# Patient Record
Sex: Male | Born: 2000 | Race: Black or African American | Hispanic: No | Marital: Single | Smoking: Never smoker
Health system: Southern US, Community
[De-identification: ages and names within clinical notes are randomized; demographics above are authoritative.]

## PROBLEM LIST (undated history)

## (undated) ENCOUNTER — Ambulatory Visit (HOSPITAL_COMMUNITY): Payer: MEDICAID | Source: Home / Self Care

## (undated) ENCOUNTER — Emergency Department (HOSPITAL_COMMUNITY): Admission: EM | Payer: Medicaid Other | Source: Home / Self Care

## (undated) DIAGNOSIS — W3400XA Accidental discharge from unspecified firearms or gun, initial encounter: Secondary | ICD-10-CM

## (undated) DIAGNOSIS — F988 Other specified behavioral and emotional disorders with onset usually occurring in childhood and adolescence: Secondary | ICD-10-CM

## (undated) HISTORY — PX: OTHER SURGICAL HISTORY: SHX169

---
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
## 2022-04-30 ENCOUNTER — Other Ambulatory Visit: Payer: Self-pay

## 2022-04-30 ENCOUNTER — Encounter (HOSPITAL_COMMUNITY): Payer: Self-pay

## 2022-04-30 ENCOUNTER — Emergency Department (HOSPITAL_COMMUNITY): Payer: Medicaid Other

## 2022-04-30 ENCOUNTER — Emergency Department (HOSPITAL_COMMUNITY)
Admission: EM | Admit: 2022-04-30 | Discharge: 2022-04-30 | Disposition: A | Discharge status: Home / Self Care | Payer: Medicaid Other | Attending: Emergency Medicine | Admitting: Emergency Medicine

## 2022-04-30 DIAGNOSIS — K5900 Constipation, unspecified: Secondary | ICD-10-CM

## 2022-04-30 DIAGNOSIS — R1030 Lower abdominal pain, unspecified: Secondary | ICD-10-CM | POA: Diagnosis present

## 2022-04-30 DIAGNOSIS — K5641 Fecal impaction: Secondary | ICD-10-CM | POA: Diagnosis not present

## 2022-04-30 DIAGNOSIS — Z1152 Encounter for screening for COVID-19: Secondary | ICD-10-CM | POA: Diagnosis not present

## 2022-04-30 LAB — CBC WITH DIFFERENTIAL/PLATELET
Abs Immature Granulocytes: 0.02 10*3/uL (ref 0.00–0.07)
Basophils Absolute: 0 10*3/uL (ref 0.0–0.1)
Basophils Relative: 1 %
Eosinophils Absolute: 0.1 10*3/uL (ref 0.0–0.5)
Eosinophils Relative: 4 %
HCT: 41.1 % (ref 39.0–52.0)
Hemoglobin: 14.4 g/dL (ref 13.0–17.0)
Immature Granulocytes: 1 %
Lymphocytes Relative: 35 %
Lymphs Abs: 1.2 10*3/uL (ref 0.7–4.0)
MCH: 31.9 pg (ref 26.0–34.0)
MCHC: 35 g/dL (ref 30.0–36.0)
MCV: 90.9 fL (ref 80.0–100.0)
Monocytes Absolute: 0.3 10*3/uL (ref 0.1–1.0)
Monocytes Relative: 10 %
Neutro Abs: 1.7 10*3/uL (ref 1.7–7.7)
Neutrophils Relative %: 49 %
Platelets: 238 10*3/uL (ref 150–400)
RBC: 4.52 MIL/uL (ref 4.22–5.81)
RDW: 11.5 % (ref 11.5–15.5)
WBC: 3.4 10*3/uL — ABNORMAL LOW (ref 4.0–10.5)
nRBC: 0 % (ref 0.0–0.2)

## 2022-04-30 LAB — COMPREHENSIVE METABOLIC PANEL
ALT: 13 U/L (ref 0–44)
AST: 24 U/L (ref 15–41)
Albumin: 3.9 g/dL (ref 3.5–5.0)
Alkaline Phosphatase: 78 U/L (ref 38–126)
Anion gap: 7 (ref 5–15)
BUN: 11 mg/dL (ref 6–20)
CO2: 28 mmol/L (ref 22–32)
Calcium: 9.4 mg/dL (ref 8.9–10.3)
Chloride: 103 mmol/L (ref 98–111)
Creatinine, Ser: 0.9 mg/dL (ref 0.61–1.24)
GFR, Estimated: >60 mL/min (ref >60)
Glucose, Bld: 99 mg/dL (ref 70–99)
Potassium: 3.9 mmol/L (ref 3.5–5.1)
Sodium: 138 mmol/L (ref 135–145)
Total Bilirubin: 0.6 mg/dL (ref 0.3–1.2)
Total Protein: 7 g/dL (ref 6.5–8.1)

## 2022-04-30 LAB — URINALYSIS, ROUTINE W REFLEX MICROSCOPIC
Bilirubin Urine: NEGATIVE
Glucose, UA: NEGATIVE mg/dL
Hgb urine dipstick: NEGATIVE
Ketones, ur: NEGATIVE mg/dL
Leukocytes,Ua: NEGATIVE
Nitrite: NEGATIVE
Protein, ur: NEGATIVE mg/dL
Specific Gravity, Urine: 1.019 (ref 1.005–1.030)
pH: 6 (ref 5.0–8.0)

## 2022-04-30 LAB — RESP PANEL BY RT-PCR (RSV, FLU A&B, COVID)  RVPGX2
Influenza A by PCR: NEGATIVE
Influenza B by PCR: NEGATIVE
Resp Syncytial Virus by PCR: NEGATIVE
SARS Coronavirus 2 by RT PCR: NEGATIVE

## 2022-04-30 MED ORDER — MAGNESIUM CITRATE PO SOLN
1.0000 | Freq: Once | ORAL | Status: AC
  Administered 2022-04-30: 1 via ORAL
  Filled 2022-04-30: qty 296

## 2022-04-30 MED ORDER — IOHEXOL 350 MG/ML SOLN
75.0000 mL | Freq: Once | INTRAVENOUS | Status: AC | PRN
  Administered 2022-04-30: 75 mL via INTRAVENOUS

## 2022-04-30 NOTE — Discharge Instructions (Addendum)
Get an enema from the drug store.  Ask the pharmacist if you can't find it.  Also drink the whole bottle of stuff you got here to help you poop.  All the test were normal except for the stool in your rectum.

## 2022-04-30 NOTE — ED Provider Triage Note (Signed)
Emergency Medicine Provider Triage Evaluation Note  Carlos Ramirez , a 22 y.o. male  was evaluated in triage.  Pt complains of who presents for abdominal pain and constipation.  Patient states he has been unable to make a bowel movement for the past 5 days.  He states he is never had anything like this before.  He denies opiate or drug abuse.  He does feel like he has hard stool in his rectum that is difficult to move and painful.  He did not take anything prior to arrival.  Review of Systems  Positive: Abdominal pain Negative: Vomiting  Physical Exam  BP 108/87 (BP Location: Right Arm)   Pulse 98   Temp 98.9 F (37.2 C) (Oral)   Resp 18   Ht 5' 6"$  (1.676 m)   Wt 54.4 kg   SpO2 97%   BMI 19.37 kg/m  Gen:   Awake, no distress   Resp:  Normal effort  MSK:   Moves extremities without difficulty  Other:  Feels hot to the touch.  He has involuntary guarding and tenderness generally over the abdomen.  Medical Decision Making  Medically screening exam initiated at 1:49 PM.  Appropriate orders placed.  Jamal C Bigford was informed that the remainder of the evaluation will be completed by another provider, this initial triage assessment does not replace that evaluation, and the importance of remaining in the ED until their evaluation is complete.     Margarita Mail, PA-C 04/30/22 1351

## 2022-04-30 NOTE — ED Triage Notes (Signed)
Pt arrived POV from home c/o being constipated and not being able to have a bowel movement x5 days. Pt states his stomach is starting to hurt because of it.

## 2022-04-30 NOTE — ED Provider Notes (Signed)
[facility] Provider Note   CSN: [id number] Arrival date & time: 04/30/22  1312     History  Chief Complaint  Patient presents with   Constipation    Carlos Ramirez is a 22 y.o. male.  Patient is a 22 year old male with a history of ADD presenting today with complaints of constipation.  He reports that he has been unable to have a bowel movement for the last 5 days.  His lower abdomen is starting to hurt.  He has not had any nausea or vomiting.  He does report he still eating.  He reports there is a lot of pain and pressure in his rectum.  Denies any urinary problems.  No fever.  He has not tried taking anything for the constipation.  He denies any new medications or dietary changes.  Normally he has bowel movements every 1 to 3 days.  The history is provided by the patient.  Constipation      Home Medications Prior to Admission medications   Medication Sig Start Date End Date Taking? Authorizing Provider  SUMAtriptan (IMITREX) 100 MG tablet Take 1 tablet (100 mg total) by mouth every 2 (two) hours as needed for migraine. May repeat in 2 hours if headache persists or recurs. 12/13/21   Barrett Henle, MD      Allergies    Patient has no known allergies.    Review of Systems   Review of Systems  Gastrointestinal:  Positive for constipation.    Physical Exam Updated Vital Signs BP 119/72   Pulse 89   Temp 98.9 F (37.2 C) (Oral)   Resp 18   Ht 5' 6"$  (1.676 m)   Wt 54.4 kg   SpO2 98%   BMI 19.37 kg/m  Physical Exam Vitals and nursing note reviewed.  Constitutional:      General: He is not in acute distress.    Appearance: He is well-developed.  HENT:     Head: Normocephalic and atraumatic.  Eyes:     Conjunctiva/sclera: Conjunctivae normal.     Pupils: Pupils are equal, round, and reactive to light.  Cardiovascular:     Rate and Rhythm: Normal rate and regular rhythm.     Heart sounds: No murmur  heard. Pulmonary:     Effort: Pulmonary effort is normal. No respiratory distress.     Breath sounds: Normal breath sounds. No wheezing or rales.  Abdominal:     General: There is no distension.     Palpations: Abdomen is soft.     Tenderness: There is abdominal tenderness in the suprapubic area and left lower quadrant. There is no guarding or rebound.  Genitourinary:    Rectum: Normal.     Comments: No fecal impaction Musculoskeletal:        General: No tenderness. Normal range of motion.     Cervical back: Normal range of motion and neck supple.  Skin:    General: Skin is warm and dry.     Findings: No erythema or rash.  Neurological:     Mental Status: He is alert and oriented to person, place, and time. Mental status is at baseline.  Psychiatric:        Mood and Affect: Mood normal.        Behavior: Behavior normal.     ED Results / Procedures / Treatments   Labs (all labs ordered are listed, but only abnormal results are displayed) Labs Reviewed  CBC WITH DIFFERENTIAL/PLATELET -  Abnormal; Notable for the following components:      Result Value   WBC 3.4 (*)    All other components within normal limits  RESP PANEL BY RT-PCR (RSV, FLU A&B, COVID)  [id number]  COMPREHENSIVE METABOLIC PANEL  URINALYSIS, ROUTINE W REFLEX MICROSCOPIC    EKG None  Radiology CT ABDOMEN PELVIS W CONTRAST  Result Date: 04/30/2022 CLINICAL DATA:  Abdominal pain, acute, nonlocalized.  Constipation. EXAM: CT ABDOMEN AND PELVIS WITH CONTRAST TECHNIQUE: Multidetector CT imaging of the abdomen and pelvis was performed using the standard protocol following bolus administration of intravenous contrast. RADIATION DOSE REDUCTION: This exam was performed according to the departmental dose-optimization program which includes automated exposure control, adjustment of the mA and/or kV according to patient size and/or use of iterative reconstruction technique. CONTRAST:  75m OMNIPAQUE IOHEXOL 350 MG/ML SOLN  COMPARISON:  05/15/2018 FINDINGS: Lower chest: Normal Hepatobiliary: Liver parenchyma is normal.  No calcified gallstones. Pancreas: Normal Spleen: Normal Adrenals/Urinary Tract: Adrenal glands are normal. Kidneys are normal. Bladder is normal. Stomach/Bowel: Stomach is full of ingested material. Small bowel pattern is normal. Appendix is normal. There is a grossly normal amount of fecal matter within the colon in general. However, the patient does appear to have rectal fecal impaction. Vascular/Lymphatic: No vascular or nodal abnormality. Reproductive: Normal Other: No free fluid or air. Musculoskeletal: Scoliotic curvature convex to the left which could simply be positional. IMPRESSION: 1. The stomach is full of ingested material. Small bowel pattern is normal. There is a grossly normal amount of fecal matter within the colon in general. However, the patient does appear to have rectal fecal impaction. 2. Scoliotic curvature convex to the left which could simply be positional. Electronically Signed   By: MNelson ChimesM.D.   On: 04/30/2022 16:48    Procedures Procedures    Medications Ordered in ED Medications  magnesium citrate solution 1 Bottle (has no administration in time range)  iohexol (OMNIPAQUE) 350 MG/ML injection 75 mL (75 mLs Intravenous Contrast Given 04/30/22 1643)    ED Course/ Medical Decision Making/ A&P                             Medical Decision Making Amount and/or Complexity of Data Reviewed Labs: ordered. Decision-making details documented in ED Course. Radiology: ordered and independent interpretation performed. Decision-making details documented in ED Course.  Risk OTC drugs.   Pt presenting with abdominal pain and constipation.  Patient has a lower abdominal pain but no rebound or guarding.  Patient does not have fecal impaction or hemorrhoids on exam.  I independently interpreted patient's labs and viral panel, CMP, CBC and UA all without acute findings. I have  independently visualized and interpreted pt's images today.  CT without diverticulitis or stones.  Radiology reports fecal impaction however not palpable on manual exam.  Will give mag citrate and also encouraged pt to get an enema.         Final Clinical Impression(s) / ED Diagnoses Final diagnoses:  Constipation, unspecified constipation type  Fecal impaction in rectum ([facility]    Rx / DC Orders ED Discharge Orders     None         PBlanchie Dessert MD 04/30/22 1720

## 2022-05-01 ENCOUNTER — Ambulatory Visit (HOSPITAL_COMMUNITY): Payer: Medicaid Other

## 2022-07-01 ENCOUNTER — Other Ambulatory Visit: Payer: Self-pay

## 2022-07-01 ENCOUNTER — Encounter (HOSPITAL_COMMUNITY): Payer: Self-pay

## 2022-07-01 ENCOUNTER — Emergency Department (HOSPITAL_COMMUNITY): Payer: Medicaid Other

## 2022-07-01 ENCOUNTER — Emergency Department (HOSPITAL_COMMUNITY)
Admission: EM | Admit: 2022-07-01 | Discharge: 2022-07-01 | Disposition: A | Discharge status: Home / Self Care | Payer: Medicaid Other | Attending: Emergency Medicine | Admitting: Emergency Medicine

## 2022-07-01 DIAGNOSIS — K5900 Constipation, unspecified: Secondary | ICD-10-CM | POA: Insufficient documentation

## 2022-07-01 DIAGNOSIS — R109 Unspecified abdominal pain: Secondary | ICD-10-CM | POA: Diagnosis present

## 2022-07-01 LAB — CBC WITH DIFFERENTIAL/PLATELET
Abs Immature Granulocytes: 0.02 10*3/uL (ref 0.00–0.07)
Basophils Absolute: 0 10*3/uL (ref 0.0–0.1)
Basophils Relative: 0 %
Eosinophils Absolute: 0 10*3/uL (ref 0.0–0.5)
Eosinophils Relative: 0 %
HCT: 41.3 % (ref 39.0–52.0)
Hemoglobin: 14.6 g/dL (ref 13.0–17.0)
Immature Granulocytes: 0 %
Lymphocytes Relative: 23 %
Lymphs Abs: 1.3 10*3/uL (ref 0.7–4.0)
MCH: 31.4 pg (ref 26.0–34.0)
MCHC: 35.4 g/dL (ref 30.0–36.0)
MCV: 88.8 fL (ref 80.0–100.0)
Monocytes Absolute: 0.4 10*3/uL (ref 0.1–1.0)
Monocytes Relative: 7 %
Neutro Abs: 4 10*3/uL (ref 1.7–7.7)
Neutrophils Relative %: 70 %
Platelets: 280 10*3/uL (ref 150–400)
RBC: 4.65 MIL/uL (ref 4.22–5.81)
RDW: 11 % — ABNORMAL LOW (ref 11.5–15.5)
WBC: 5.7 10*3/uL (ref 4.0–10.5)
nRBC: 0 % (ref 0.0–0.2)

## 2022-07-01 LAB — COMPREHENSIVE METABOLIC PANEL
ALT: 11 U/L (ref 0–44)
AST: 25 U/L (ref 15–41)
Albumin: 4.3 g/dL (ref 3.5–5.0)
Alkaline Phosphatase: 71 U/L (ref 38–126)
Anion gap: 10 (ref 5–15)
BUN: 12 mg/dL (ref 6–20)
CO2: 24 mmol/L (ref 22–32)
Calcium: 9.3 mg/dL (ref 8.9–10.3)
Chloride: 104 mmol/L (ref 98–111)
Creatinine, Ser: 0.97 mg/dL (ref 0.61–1.24)
GFR, Estimated: >60 mL/min (ref >60)
Glucose, Bld: 96 mg/dL (ref 70–99)
Potassium: 4.3 mmol/L (ref 3.5–5.1)
Sodium: 138 mmol/L (ref 135–145)
Total Bilirubin: 1.1 mg/dL (ref 0.3–1.2)
Total Protein: 7.1 g/dL (ref 6.5–8.1)

## 2022-07-01 LAB — LIPASE, BLOOD: Lipase: 29 U/L (ref 11–51)

## 2022-07-01 NOTE — ED Provider Notes (Signed)
[place] EMERGENCY DEPARTMENT AT [facility] Provider Note   CSN: [id number] Arrival date & time: 07/01/22  1821     History  Chief Complaint  Patient presents with   Constipation    Carlos Ramirez is a 22 y.o. male.  22 yo M with a chief complaints of constipation.  This is a recurrent issue for him.  Has not gone in about 5 days.  Feeling some pain and pressure down at his rectum.  Had 1 episode where he vomited.  No fevers or chills.  Diffuse crampy abdominal discomfort.   Constipation      Home Medications Prior to Admission medications   Medication Sig Start Date End Date Taking? Authorizing Provider  SUMAtriptan (IMITREX) 100 MG tablet Take 1 tablet (100 mg total) by mouth every 2 (two) hours as needed for migraine. May repeat in 2 hours if headache persists or recurs. 12/13/21   Zenia Resides, MD      Allergies    Patient has no known allergies.    Review of Systems   Review of Systems  Gastrointestinal:  Positive for constipation.    Physical Exam Updated Vital Signs BP 122/67 (BP Location: Right Arm)   Pulse 64   Temp 99.1 F (37.3 C) (Oral)   Resp 17   Ht  (1.676 m)   Wt 54.4 kg   SpO2 100%   BMI 19.36 kg/m  Physical Exam Vitals and nursing note reviewed.  Constitutional:      Appearance: He is well-developed.  HENT:     Head: Normocephalic and atraumatic.  Eyes:     Pupils: Pupils are equal, round, and reactive to light.  Neck:     Vascular: No JVD.  Cardiovascular:     Rate and Rhythm: Normal rate and regular rhythm.     Heart sounds: No murmur heard.    No friction rub. No gallop.  Pulmonary:     Effort: No respiratory distress.     Breath sounds: No wheezing.  Abdominal:     General: There is no distension.     Tenderness: There is no abdominal tenderness. There is no guarding or rebound.     Comments: Benign abdominal pain  Musculoskeletal:        General: Normal range of motion.     Cervical back: Normal  range of motion and neck supple.  Skin:    Coloration: Skin is not pale.     Findings: No rash.  Neurological:     Mental Status: He is alert and oriented to person, place, and time.  Psychiatric:        Behavior: Behavior normal.     ED Results / Procedures / Treatments   Labs (all labs ordered are listed, but only abnormal results are displayed) Labs Reviewed  CBC WITH DIFFERENTIAL/PLATELET - Abnormal; Notable for the following components:      Result Value   RDW 11.0 (*)    All other components within normal limits  COMPREHENSIVE METABOLIC PANEL  LIPASE, BLOOD  URINALYSIS, ROUTINE W REFLEX MICROSCOPIC    EKG None  Radiology DG Abdomen 1 View  Result Date: 07/01/2022 CLINICAL DATA:  Constipation EXAM: ABDOMEN - 1 VIEW COMPARISON:  None Available. FINDINGS: Nonobstructive bowel-gas pattern. Moderate stool burden. Visualized lungs are clear. Mild levocurvature of the lumbar spine. No radio-opaque calculi or other significant radiographic abnormality are seen. IMPRESSION: Nonobstructive bowel-gas pattern. Moderate stool burden. Electronically Signed   By: Aliene Altes.D.  On: 07/01/2022 18:59    Procedures Procedures    Medications Ordered in ED Medications - No data to display  ED Course/ Medical Decision Making/ A&P                             Medical Decision Making Amount and/or Complexity of Data Reviewed Labs: ordered.   22 yo M with a chief complaints of constipation.  He is well-appearing and nontoxic.  Benign abdominal exam.  No significant electrolyte abnormalities, no leukocytosis no anemia.  Will have him treat supportively at home.  Trial of MiraLAX.  PCP follow-up.   8:44 PM:  I have discussed the diagnosis/risks/treatment options with the patient.  Evaluation and diagnostic testing in the emergency department does not suggest an emergent condition requiring admission or immediate intervention beyond what has been performed at this time.  They  will follow up with PCP. We also discussed returning to the ED immediately if new or worsening sx occur. We discussed the sx which are most concerning (e.g., sudden worsening pain, fever, inability to tolerate by mouth) that necessitate immediate return. Medications administered to the patient during their visit and any new prescriptions provided to the patient are listed below.  Medications given during this visit Medications - No data to display   The patient appears reasonably screen and/or stabilized for discharge and I doubt any other medical condition or other [facility] requiring further screening, evaluation, or treatment in the ED at this time prior to discharge.          Final Clinical Impression(s) / ED Diagnoses Final diagnoses:  Constipation, unspecified constipation type    Rx / DC Orders ED Discharge Orders     None         Melene Plan, DO 07/01/22 2044

## 2022-07-01 NOTE — ED Notes (Signed)
Pt unable to be located to be discharged and IV removed. Pt phone called and was asked to come back inside facility for removal of IV. Pt came back and IV was removed by staff.

## 2022-07-01 NOTE — ED Triage Notes (Signed)
Pt arrived via [facility] from home for c/o constipationx1wk. Pt hasn't eaten anything in two days. Pt drank a bottle of mag citrate 45 mins ago, but vomited most of the bottle up. Pt denies N/V prior to drinking the mag citrate. Pt states took tow hydrocodones for pain and about two days after that the constipation started. Pt states has not been passing gas

## 2022-07-01 NOTE — Discharge Instructions (Signed)
Take 8 scoops of miralax in 32oz of whatever you would like to drink.(Gatorade comes in this size) You can also use a fleets enema which you can buy over the counter at the pharmacy.  Return for worsening abdominal pain, vomiting or fever.  Follow up with GI in the office.  Establish with a PCP.

## 2022-07-01 NOTE — ED Provider Triage Note (Signed)
Emergency Medicine Provider Triage Evaluation Note  Carlos Ramirez , a 22 y.o. male  was evaluated in triage.  Pt complains of constipation.  Patient reports that he recently took hydrocodone pain medication secondary to bumping his leg on the wall.  Patient states that this occurred 3 to 4 days ago.  Patient states that the last 7 days he has not had a bowel movement.  Patient reports history of the same.  Patient states he is not passing flatus.  Patient denies history of abdominal surgeries.  Patient reports he has been taking mag citrate, MiraLAX, Colace stool softener without relief.  No abdominal tenderness noted.  Review of Systems  Positive:  Negative:   Physical Exam  BP 122/67 (BP Location: Right Arm)   Pulse 64   Temp 99.1 F (37.3 C) (Oral)   Resp 17   Ht  (1.676 m)   Wt 54.4 kg   SpO2 100%   BMI 19.36 kg/m  Gen:   Awake, no distress   Resp:  Normal effort  MSK:   Moves extremities without difficulty  Other:    Medical Decision Making  Medically screening exam initiated at 6:38 PM.  Appropriate orders placed.  Carlos Ramirez was informed that the remainder of the evaluation will be completed by another provider, this initial triage assessment does not replace that evaluation, and the importance of remaining in the ED until their evaluation is complete.     Carlos Decant, PA-C 07/01/22 Carlos Ramirez

## 2022-10-08 ENCOUNTER — Emergency Department (HOSPITAL_COMMUNITY)
Admission: EM | Admit: 2022-10-08 | Discharge: 2022-10-08 | Disposition: A | Discharge status: Home / Self Care | Payer: MEDICAID | Attending: Emergency Medicine | Admitting: Emergency Medicine

## 2022-10-08 ENCOUNTER — Other Ambulatory Visit: Payer: Self-pay

## 2022-10-08 ENCOUNTER — Encounter (HOSPITAL_COMMUNITY): Payer: Self-pay

## 2022-10-08 ENCOUNTER — Emergency Department (HOSPITAL_COMMUNITY): Payer: MEDICAID

## 2022-10-08 DIAGNOSIS — K5900 Constipation, unspecified: Secondary | ICD-10-CM | POA: Insufficient documentation

## 2022-10-08 MED ORDER — LACTULOSE 10 GM/15ML PO SOLN
10.0000 g | Freq: Once | ORAL | Status: AC
  Administered 2022-10-08: 10 g via ORAL
  Filled 2022-10-08: qty 15

## 2022-10-08 MED ORDER — LACTULOSE 10 GM/15ML PO SOLN: 10.0000 g | Freq: Two times a day (BID) | ORAL | 0 refills | Status: DC | PRN

## 2022-10-08 MED ORDER — LIDOCAINE HCL URETHRAL/MUCOSAL 2 % EX GEL
1.0000 | Freq: Once | CUTANEOUS | Status: AC
  Administered 2022-10-08: 1 via TOPICAL
  Filled 2022-10-08: qty 11

## 2022-10-08 NOTE — Discharge Instructions (Signed)
Make sure you are drinking plenty of fluids.  Use the medication to help you have a bowel movement if you are not able to have a bowel movement by 2:00 this afternoon take another dose of 10 mL.  Make sure you are drinking plenty of fluids and eating fruits and vegetables.  If you start having fever, vomiting, worsening abdominal pain return to the emergency room.

## 2022-10-08 NOTE — ED Notes (Signed)
Refused urine sample 

## 2022-10-08 NOTE — ED Triage Notes (Signed)
Pt c/o constipation & abdominal pain for 2 days,. Took herbal supplement with no relief and has been dieting with chips and limited fluid intake.

## 2022-10-08 NOTE — ED Notes (Signed)
Refused labs.
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
## 2023-01-12 ENCOUNTER — Ambulatory Visit (HOSPITAL_COMMUNITY)
Admission: EM | Admit: 2023-01-12 | Discharge: 2023-01-12 | Disposition: A | Discharge status: Home / Self Care | Payer: MEDICAID | Attending: Family Medicine | Admitting: Family Medicine

## 2023-01-12 ENCOUNTER — Ambulatory Visit (INDEPENDENT_AMBULATORY_CARE_PROVIDER_SITE_OTHER): Payer: MEDICAID

## 2023-01-12 ENCOUNTER — Encounter (HOSPITAL_COMMUNITY): Payer: Self-pay | Admitting: Emergency Medicine

## 2023-01-12 DIAGNOSIS — S82891A Other fracture of right lower leg, initial encounter for closed fracture: Secondary | ICD-10-CM | POA: Diagnosis not present

## 2023-01-12 MED ORDER — IBUPROFEN 800 MG PO TABS: 800.0000 mg | ORAL_TABLET | Freq: Three times a day (TID) | ORAL | 0 refills | Status: DC | PRN

## 2023-01-12 NOTE — ED Triage Notes (Signed)
Pt present with right foot injury. He was running outside last night at his house and jumped and landed on his foot wrong.
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
## 2023-01-12 NOTE — Discharge Instructions (Signed)
By my review there is a fracture on your ankle bone, and possibly 1 on the long metatarsal bone on the side of your foot.  Take ibuprofen 800 mg--1 tab every 8 hours as needed for pain.  Ice and elevate your foot and ankle today.

## 2023-04-30 ENCOUNTER — Ambulatory Visit (HOSPITAL_COMMUNITY)
Admission: EM | Admit: 2023-04-30 | Discharge: 2023-04-30 | Disposition: A | Discharge status: Home / Self Care | Payer: MEDICAID | Attending: Emergency Medicine | Admitting: Emergency Medicine

## 2023-04-30 ENCOUNTER — Encounter (HOSPITAL_COMMUNITY): Payer: Self-pay

## 2023-04-30 DIAGNOSIS — Z113 Encounter for screening for infections with a predominantly sexual mode of transmission: Secondary | ICD-10-CM | POA: Insufficient documentation

## 2023-04-30 DIAGNOSIS — K047 Periapical abscess without sinus: Secondary | ICD-10-CM | POA: Diagnosis present

## 2023-04-30 DIAGNOSIS — K130 Diseases of lips: Secondary | ICD-10-CM | POA: Insufficient documentation

## 2023-04-30 LAB — HIV ANTIBODY (ROUTINE TESTING W REFLEX): HIV Screen 4th Generation wRfx: NONREACTIVE

## 2023-04-30 MED ORDER — AMOXICILLIN-POT CLAVULANATE 875-125 MG PO TABS: 1.0000 | ORAL_TABLET | Freq: Two times a day (BID) | ORAL | 0 refills | Status: DC

## 2023-04-30 MED ORDER — KETOROLAC TROMETHAMINE 30 MG/ML IJ SOLN: 30.0000 mg | Freq: Once | INTRAMUSCULAR | Status: DC

## 2023-04-30 MED ORDER — MUPIROCIN CALCIUM 2 % EX CREA: 1.0000 | TOPICAL_CREAM | Freq: Two times a day (BID) | CUTANEOUS | 0 refills | Status: DC

## 2023-04-30 MED ORDER — KETOROLAC TROMETHAMINE 30 MG/ML IJ SOLN
INTRAMUSCULAR | Status: AC
  Filled 2023-04-30: qty 1

## 2023-04-30 NOTE — Discharge Instructions (Signed)
 Start taking Augmentin twice daily for 7 days for dental and lip infection. I have also prescribed mupirocin ointment for you to apply to your lip for infection coverage. You were given a Toradol injection in clinic today to help reduce inflammation that is causing your pain. Alternate Tylenol and Ibuprofen every 4-6 hours as needed for pain.   Your results will come back over the next few days and someone will call if results are positive and require treatment.  Return here as needed.
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
## 2023-05-01 LAB — CYTOLOGY, (ORAL, ANAL, URETHRAL) ANCILLARY ONLY
Chlamydia: NEGATIVE
Comment: NEGATIVE
Comment: NORMAL
Neisseria Gonorrhea: NEGATIVE

## 2023-05-01 LAB — RPR: RPR Ser Ql: NONREACTIVE

## 2023-05-30 ENCOUNTER — Other Ambulatory Visit: Payer: Self-pay

## 2023-05-30 ENCOUNTER — Emergency Department (HOSPITAL_COMMUNITY)
Admission: EM | Admit: 2023-05-30 | Discharge: 2023-05-31 | Discharge status: Against Medical Advice | Payer: MEDICAID | Attending: Emergency Medicine | Admitting: Emergency Medicine

## 2023-05-30 ENCOUNTER — Encounter (HOSPITAL_COMMUNITY): Payer: Self-pay

## 2023-05-30 ENCOUNTER — Encounter (HOSPITAL_COMMUNITY): Payer: Self-pay | Admitting: *Deleted

## 2023-05-30 ENCOUNTER — Emergency Department (HOSPITAL_COMMUNITY)
Admission: EM | Admit: 2023-05-30 | Discharge: 2023-05-30 | Disposition: A | Discharge status: Home / Self Care | Payer: MEDICAID | Attending: Emergency Medicine | Admitting: Emergency Medicine

## 2023-05-30 DIAGNOSIS — R079 Chest pain, unspecified: Secondary | ICD-10-CM | POA: Insufficient documentation

## 2023-05-30 DIAGNOSIS — Z5321 Procedure and treatment not carried out due to patient leaving prior to being seen by health care provider: Secondary | ICD-10-CM | POA: Insufficient documentation

## 2023-05-30 DIAGNOSIS — Y92009 Unspecified place in unspecified non-institutional (private) residence as the place of occurrence of the external cause: Secondary | ICD-10-CM | POA: Insufficient documentation

## 2023-05-30 DIAGNOSIS — J069 Acute upper respiratory infection, unspecified: Secondary | ICD-10-CM | POA: Diagnosis not present

## 2023-05-30 DIAGNOSIS — R519 Headache, unspecified: Secondary | ICD-10-CM | POA: Insufficient documentation

## 2023-05-30 DIAGNOSIS — W1830XA Fall on same level, unspecified, initial encounter: Secondary | ICD-10-CM | POA: Insufficient documentation

## 2023-05-30 DIAGNOSIS — J111 Influenza due to unidentified influenza virus with other respiratory manifestations: Secondary | ICD-10-CM

## 2023-05-30 LAB — SARS CORONAVIRUS 2 BY RT PCR: SARS Coronavirus 2 by RT PCR: NEGATIVE

## 2023-05-30 MED ORDER — ACETAMINOPHEN 500 MG PO TABS: 1000.0000 mg | ORAL_TABLET | Freq: Once | ORAL | Status: DC

## 2023-05-30 NOTE — Discharge Instructions (Addendum)
 Today you were seen for an upper respiratory infection.  You may alternate taking Tylenol and Motrin as needed for fever and pain, Flonase as needed for nasal congestion, and plain Mucinex as needed for chest congestion.  Thank you for letting us treat you today. After reviewing your labs, I feel you are safe to go home.  Please follow up with your PCP in the next several days and provide them with your records from this visit. Return to the Emergency Room if pain becomes severe or symptoms worsen.
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
## 2023-05-30 NOTE — ED Triage Notes (Signed)
 The pt fell sometime today on the concrete at home he was seen here earlier today for the same thing.  He is c/o a headache and chest pain he is not answering very clearly  keeping his hand over his eyes he reports that the light hurts his eyes

## 2023-05-30 NOTE — ED Notes (Signed)
 Pt not responding for vitals

## 2023-05-30 NOTE — ED Notes (Signed)
 Awaiting patient from lobby.
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
## 2023-05-30 NOTE — ED Triage Notes (Signed)
 Pt c/o bodyaches and Hax1wk. Pt denies N/V/D

## 2023-05-31 NOTE — ED Notes (Signed)
 Pt name called for updated vitals no response.

## 2023-07-29 ENCOUNTER — Encounter (HOSPITAL_COMMUNITY): Payer: Self-pay

## 2023-07-29 ENCOUNTER — Ambulatory Visit (HOSPITAL_COMMUNITY)
Admission: EM | Admit: 2023-07-29 | Discharge: 2023-07-29 | Disposition: A | Discharge status: Home / Self Care | Payer: MEDICAID | Attending: Nurse Practitioner | Admitting: Nurse Practitioner

## 2023-07-29 DIAGNOSIS — S025XXA Fracture of tooth (traumatic), initial encounter for closed fracture: Secondary | ICD-10-CM | POA: Diagnosis not present

## 2023-07-29 DIAGNOSIS — K0889 Other specified disorders of teeth and supporting structures: Secondary | ICD-10-CM

## 2023-07-29 DIAGNOSIS — K029 Dental caries, unspecified: Secondary | ICD-10-CM

## 2023-07-29 MED ORDER — NAPROXEN 500 MG PO TABS: 500.0000 mg | ORAL_TABLET | Freq: Two times a day (BID) | ORAL | 0 refills | Status: AC

## 2023-07-29 MED ORDER — CHLORHEXIDINE GLUCONATE 0.12 % MT SOLN: 15.0000 mL | Freq: Two times a day (BID) | OROMUCOSAL | 0 refills | Status: AC

## 2023-07-29 MED ORDER — AMOXICILLIN-POT CLAVULANATE 875-125 MG PO TABS: 1.0000 | ORAL_TABLET | Freq: Two times a day (BID) | ORAL | 0 refills | Status: DC

## 2023-07-29 MED ORDER — OXYCODONE HCL 5 MG PO TABS: 5.0000 mg | ORAL_TABLET | Freq: Four times a day (QID) | ORAL | 0 refills | Status: DC | PRN

## 2023-07-29 NOTE — Discharge Instructions (Addendum)
 You were seen today for dental pain. The exam showed tooth decay and a broken tooth, which are most likely causing your discomfort. It is very important that you see a dentist as soon as possible to fully treat the problem.   Take the antibiotics exactly as prescribed to help prevent or treat any infection. Use the prescribed mouth rinse twice a day after brushing your teeth to help keep the area clean. After eating, rinse your mouth with water  to remove any food particles and reduce irritation.Take the naproxen twice a day with food to help with pain and inflammation. Do not take any aspirin or other over-the-counter NSAIDs while using this medication. You may take the prescribed oxycodone  as needed for severe pain. Tylenol  can also be used along with the other medications. Take two 500 mg tablets (a total of 1000 mg) every 6 hours as needed. Do not take more than 4000 mg of Tylenol  in 24 hours. Avoid any foods or drinks that make the pain worse. Stick to soft foods while your mouth is healing, avoid chewing on the affected side and eat soft foods. Do not use straws or smoke as this can delay healing.

## 2023-07-29 NOTE — ED Triage Notes (Signed)
 Patient here today with c/o left side dental pain since last month. He has been taking Ibuprofen  with no relief.
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
## 2023-08-18 ENCOUNTER — Telehealth (HOSPITAL_COMMUNITY): Payer: Self-pay | Admitting: *Deleted

## 2023-10-03 ENCOUNTER — Ambulatory Visit
Admission: RE | Admit: 2023-10-03 | Discharge: 2023-10-03 | Disposition: A | Discharge status: Home / Self Care | Payer: MEDICAID | Source: Ambulatory Visit | Attending: Family Medicine | Admitting: Family Medicine

## 2023-10-03 ENCOUNTER — Ambulatory Visit: Payer: Self-pay

## 2023-10-03 ENCOUNTER — Ambulatory Visit (INDEPENDENT_AMBULATORY_CARE_PROVIDER_SITE_OTHER): Payer: MEDICAID

## 2023-10-03 VITALS — BP 120/68 | HR 88 | Temp 98.8°F | Resp 18

## 2023-10-03 DIAGNOSIS — M25511 Pain in right shoulder: Secondary | ICD-10-CM | POA: Diagnosis not present

## 2023-10-03 DIAGNOSIS — M25331 Other instability, right wrist: Secondary | ICD-10-CM | POA: Diagnosis not present

## 2023-10-03 DIAGNOSIS — M25531 Pain in right wrist: Secondary | ICD-10-CM

## 2023-10-03 DIAGNOSIS — M79641 Pain in right hand: Secondary | ICD-10-CM

## 2023-10-03 DIAGNOSIS — M25521 Pain in right elbow: Secondary | ICD-10-CM

## 2023-10-03 DIAGNOSIS — S62330A Displaced fracture of neck of second metacarpal bone, right hand, initial encounter for closed fracture: Secondary | ICD-10-CM

## 2023-10-03 MED ORDER — HYDROCODONE-ACETAMINOPHEN 5-325 MG PO TABS: 1.0000 | ORAL_TABLET | Freq: Four times a day (QID) | ORAL | 0 refills | Status: DC | PRN

## 2023-10-03 NOTE — ED Notes (Signed)
 Pt states he wanted to go to the ED after leaving UC.

## 2023-10-03 NOTE — ED Notes (Signed)
 Patient educated on the purpose of the splint and how it would be positioned. Patient also educated on medications for pain management. Patient wondering if he wants the splint applied.
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
## 2023-10-03 NOTE — Discharge Instructions (Addendum)
 Please keep your wrist/hand in the splint placed in the clinic until you are seen by orthopedics.  Per your request you may go to the emergency room for further pain management options.  Referral has already been placed to orthopedics please contact the number above to make an appointment as soon as possible

## 2023-10-03 NOTE — ED Triage Notes (Addendum)
 Pt present with rt arm and hand swelling. Pt states he was playing basketball and someone weighing more than him fell on him. Pt states he feels sharp pain from the hand to his shoulders.   States he took ibuprofen  this morning and tylenol .

## 2023-10-18 ENCOUNTER — Ambulatory Visit
Admission: EM | Admit: 2023-10-18 | Discharge: 2023-10-18 | Disposition: A | Discharge status: Home / Self Care | Payer: MEDICAID | Attending: Family Medicine | Admitting: Family Medicine

## 2023-10-18 DIAGNOSIS — Z23 Encounter for immunization: Secondary | ICD-10-CM | POA: Diagnosis not present

## 2023-10-18 DIAGNOSIS — W540XXA Bitten by dog, initial encounter: Secondary | ICD-10-CM

## 2023-10-18 DIAGNOSIS — S61452A Open bite of left hand, initial encounter: Secondary | ICD-10-CM | POA: Diagnosis not present

## 2023-10-18 MED ORDER — TETANUS-DIPHTH-ACELL PERTUSSIS 5-2.5-18.5 LF-MCG/0.5 IM SUSY
0.5000 mL | PREFILLED_SYRINGE | Freq: Once | INTRAMUSCULAR | Status: AC
  Administered 2023-10-18: 0.5 mL via INTRAMUSCULAR

## 2023-10-18 MED ORDER — AMOXICILLIN-POT CLAVULANATE 875-125 MG PO TABS: 1.0000 | ORAL_TABLET | Freq: Two times a day (BID) | ORAL | 0 refills | Status: AC
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
## 2023-10-18 NOTE — ED Triage Notes (Addendum)
 Pt present with dog bite to the middle finger and thumb on the lt hand. Pt states he felt pain go up to the lt forearm. Pt states he was bitten last night. Pt states the dog is vaccinated.

## 2023-10-18 NOTE — Discharge Instructions (Addendum)
 Keep the wounds clean and dry.  Your tetanus was updated in the clinic and is good for 10 years.  Start Augmentin  twice daily for 3 days to prevent infection.  Please go to emergency room if you develop any symptoms such as fever, swelling, redness, warmth of the bite area, or any new concerns that arise.  Please follow-up with your PCP if your symptoms do not improve.  Hope you feel better soon!

## 2024-04-16 ENCOUNTER — Emergency Department (HOSPITAL_COMMUNITY): Payer: MEDICAID

## 2024-04-16 ENCOUNTER — Encounter (HOSPITAL_COMMUNITY): Payer: Self-pay | Admitting: *Deleted

## 2024-04-16 ENCOUNTER — Other Ambulatory Visit: Payer: Self-pay

## 2024-04-16 ENCOUNTER — Ambulatory Visit (HOSPITAL_COMMUNITY)
Admission: EM | Admit: 2024-04-16 | Discharge: 2024-04-16 | Disposition: A | Discharge status: Home / Self Care | Payer: MEDICAID | Source: Home / Self Care

## 2024-04-16 ENCOUNTER — Encounter (HOSPITAL_COMMUNITY): Payer: Self-pay | Admitting: Emergency Medicine

## 2024-04-16 ENCOUNTER — Emergency Department (HOSPITAL_COMMUNITY)
Admission: EM | Admit: 2024-04-16 | Discharge: 2024-04-16 | Disposition: A | Discharge status: Home / Self Care | Payer: MEDICAID | Source: Home / Self Care | Attending: Emergency Medicine | Admitting: Emergency Medicine

## 2024-04-16 DIAGNOSIS — R519 Headache, unspecified: Secondary | ICD-10-CM

## 2024-04-16 LAB — CBC
HCT: 43.4 % (ref 39.0–52.0)
Hemoglobin: 14.6 g/dL (ref 13.0–17.0)
MCH: 31.7 pg (ref 26.0–34.0)
MCHC: 33.6 g/dL (ref 30.0–36.0)
MCV: 94.1 fL (ref 80.0–100.0)
Platelets: 326 10*3/uL (ref 150–400)
RBC: 4.61 MIL/uL (ref 4.22–5.81)
RDW: 11.9 % (ref 11.5–15.5)
WBC: 6.2 10*3/uL (ref 4.0–10.5)
nRBC: 0 % (ref 0.0–0.2)

## 2024-04-16 LAB — BASIC METABOLIC PANEL WITH GFR
Anion gap: 10 (ref 5–15)
BUN: 11 mg/dL (ref 6–20)
CO2: 25 mmol/L (ref 22–32)
Calcium: 9.6 mg/dL (ref 8.9–10.3)
Chloride: 102 mmol/L (ref 98–111)
Creatinine, Ser: 0.91 mg/dL (ref 0.61–1.24)
GFR, Estimated: >60 mL/min
Glucose, Bld: 98 mg/dL (ref 70–99)
Potassium: 4.2 mmol/L (ref 3.5–5.1)
Sodium: 137 mmol/L (ref 135–145)

## 2024-04-16 MED ORDER — KETOROLAC TROMETHAMINE 30 MG/ML IJ SOLN
30.0000 mg | Freq: Once | INTRAMUSCULAR | Status: AC
  Administered 2024-04-16: 30 mg via INTRAVENOUS
  Filled 2024-04-16: qty 1

## 2024-04-16 MED ORDER — IBUPROFEN 800 MG PO TABS: 800.0000 mg | ORAL_TABLET | Freq: Three times a day (TID) | ORAL | 0 refills | Status: AC | PRN

## 2024-04-16 MED ORDER — PROCHLORPERAZINE EDISYLATE 10 MG/2ML IJ SOLN
10.0000 mg | Freq: Once | INTRAMUSCULAR | Status: AC
  Administered 2024-04-16: 10 mg via INTRAVENOUS
  Filled 2024-04-16: qty 2

## 2024-04-16 NOTE — ED Notes (Signed)
 ED CN called report
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
## 2024-04-16 NOTE — ED Notes (Signed)
EMS at bedside for transport

## 2024-04-16 NOTE — ED Notes (Signed)
 Transported to CT

## 2024-04-16 NOTE — ED Triage Notes (Signed)
 Information by friend.PT was walking to the bed room and just fell .

## 2024-04-16 NOTE — Discharge Instructions (Signed)
 The test today in the emergency room were normal.  Your CAT scan and hand x-ray were normal.  Your blood test were normal.  It is possible your symptoms are related to a migraine type headache.  Take the medications as needed for persistent symptoms.  Follow-up with a primary care doctor to be rechecked

## 2024-04-16 NOTE — ED Notes (Signed)
 [facility] called for transport. [facility] approved this clinical research associate to call EMS for transport to ED.

## 2024-04-16 NOTE — ED Triage Notes (Signed)
 Pt here from UC , per ems  and UC note pt here for headache , will not answer questions when asked

## 2024-04-16 NOTE — ED Triage Notes (Signed)
 PT with assistance of friend reported his HA started a few days ago. Pt also has RT index finger pain. Pt is not answering questions.
